# Patient Record
Sex: Male | Born: 1950 | Race: White | Hispanic: No | Marital: Married | Smoking: Former smoker
Health system: Southern US, Community
[De-identification: ages and names within clinical notes are randomized; demographics above are authoritative.]

## PROBLEM LIST (undated history)

## (undated) ENCOUNTER — Emergency Department (HOSPITAL_BASED_OUTPATIENT_CLINIC_OR_DEPARTMENT_OTHER): Admission: EM | Payer: Managed Care, Other (non HMO) | Source: Home / Self Care

## (undated) DIAGNOSIS — E1142 Type 2 diabetes mellitus with diabetic polyneuropathy: Secondary | ICD-10-CM

## (undated) HISTORY — DX: Type 2 diabetes mellitus with diabetic polyneuropathy: E11.42

---

## 2001-07-16 ENCOUNTER — Ambulatory Visit (HOSPITAL_COMMUNITY): Admission: RE | Admit: 2001-07-16 | Discharge: 2001-07-16 | Payer: Self-pay | Admitting: General Surgery

## 2001-09-10 ENCOUNTER — Ambulatory Visit (HOSPITAL_BASED_OUTPATIENT_CLINIC_OR_DEPARTMENT_OTHER): Admission: RE | Admit: 2001-09-10 | Discharge: 2001-09-10 | Payer: Self-pay | Admitting: Family Medicine

## 2004-01-30 ENCOUNTER — Ambulatory Visit: Payer: Self-pay | Admitting: Cardiology

## 2005-04-22 ENCOUNTER — Ambulatory Visit: Payer: Self-pay | Admitting: Cardiology

## 2010-09-12 ENCOUNTER — Other Ambulatory Visit: Payer: Self-pay | Admitting: Neurosurgery

## 2010-09-12 DIAGNOSIS — M47812 Spondylosis without myelopathy or radiculopathy, cervical region: Secondary | ICD-10-CM

## 2010-09-13 ENCOUNTER — Ambulatory Visit
Admission: RE | Admit: 2010-09-13 | Discharge: 2010-09-13 | Disposition: A | Discharge status: Home / Self Care | Payer: Managed Care, Other (non HMO) | Source: Ambulatory Visit | Attending: Neurosurgery | Admitting: Neurosurgery

## 2010-09-13 DIAGNOSIS — M47812 Spondylosis without myelopathy or radiculopathy, cervical region: Secondary | ICD-10-CM

## 2010-10-11 ENCOUNTER — Other Ambulatory Visit: Payer: Self-pay | Admitting: Neurosurgery

## 2010-10-11 DIAGNOSIS — M47812 Spondylosis without myelopathy or radiculopathy, cervical region: Secondary | ICD-10-CM

## 2010-10-15 ENCOUNTER — Ambulatory Visit
Admission: RE | Admit: 2010-10-15 | Discharge: 2010-10-15 | Disposition: A | Discharge status: Home / Self Care | Payer: Managed Care, Other (non HMO) | Source: Ambulatory Visit | Attending: Neurosurgery | Admitting: Neurosurgery

## 2010-10-15 DIAGNOSIS — M542 Cervicalgia: Secondary | ICD-10-CM

## 2010-10-15 DIAGNOSIS — M47812 Spondylosis without myelopathy or radiculopathy, cervical region: Secondary | ICD-10-CM

## 2010-10-15 MED ORDER — IOHEXOL 180 MG/ML  SOLN: 1.0000 mL | Freq: Once | INTRAMUSCULAR | Status: DC | PRN

## 2010-10-15 MED ORDER — TRIAMCINOLONE ACETONIDE 40 MG/ML IJ SUSP (RADIOLOGY)
60.0000 mg | Freq: Once | INTRAMUSCULAR | Status: AC
  Administered 2010-10-15: 60 mg via EPIDURAL

## 2010-10-15 MED ORDER — IOHEXOL 300 MG/ML  SOLN
1.0000 mL | Freq: Once | INTRAMUSCULAR | Status: AC | PRN
  Administered 2010-10-15: 1 mL via EPIDURAL

## 2015-08-01 ENCOUNTER — Encounter: Payer: Self-pay | Admitting: *Deleted

## 2015-08-01 NOTE — Progress Notes (Signed)
This encounter was created in error - please disregard.

## 2015-08-08 ENCOUNTER — Telehealth: Payer: Self-pay | Admitting: Cardiology

## 2015-08-08 ENCOUNTER — Encounter: Payer: Self-pay | Admitting: Cardiology

## 2015-08-08 ENCOUNTER — Encounter: Payer: Self-pay | Admitting: *Deleted

## 2015-08-08 ENCOUNTER — Ambulatory Visit (INDEPENDENT_AMBULATORY_CARE_PROVIDER_SITE_OTHER): Payer: BLUE CROSS/BLUE SHIELD | Admitting: Cardiology

## 2015-08-08 ENCOUNTER — Other Ambulatory Visit: Payer: Self-pay | Admitting: Cardiology

## 2015-08-08 VITALS — BP 118/78 | HR 72 | Ht 66.0 in | Wt 211.0 lb

## 2015-08-08 DIAGNOSIS — Z136 Encounter for screening for cardiovascular disorders: Secondary | ICD-10-CM

## 2015-08-08 DIAGNOSIS — E1142 Type 2 diabetes mellitus with diabetic polyneuropathy: Secondary | ICD-10-CM | POA: Diagnosis not present

## 2015-08-08 DIAGNOSIS — I200 Unstable angina: Secondary | ICD-10-CM

## 2015-08-08 DIAGNOSIS — E785 Hyperlipidemia, unspecified: Secondary | ICD-10-CM | POA: Diagnosis not present

## 2015-08-08 DIAGNOSIS — Z8249 Family history of ischemic heart disease and other diseases of the circulatory system: Secondary | ICD-10-CM

## 2015-08-08 NOTE — Telephone Encounter (Signed)
Left heart cath on Thursday, Aug 10, 2015 @9 :00 am with Dr. Katrinka BlazingSmith arrive @7 :00 am dx: accelerating angina Checking percert

## 2015-08-08 NOTE — Telephone Encounter (Signed)
No precert required 

## 2015-08-08 NOTE — Patient Instructions (Signed)
Your physician recommends that you continue on your current medications as directed. Please refer to the Current Medication list given to you today. Your physician has requested that you have a cardiac catheterization. Cardiac catheterization is used to diagnose and/or treat various heart conditions. Doctors may recommend this procedure for a number of different reasons. The most common reason is to evaluate chest pain. Chest pain can be a symptom of coronary artery disease (CAD), and cardiac catheterization can show whether plaque is narrowing or blocking your heart's arteries. This procedure is also used to evaluate the valves, as well as measure the blood flow and oxygen levels in different parts of your heart. For further information please visit www.cardiosmart.org. Please follow instruction sheet, as given. Your physician recommends that you schedule a follow-up appointment in: 1 month. 

## 2015-08-08 NOTE — Progress Notes (Signed)
  Cardiology Office Note  Date: 08/08/2015   ID: Devon Morris, DOB 05/02/1950, MRN [id number]  PCP: HOWARD, KEVIN, MD  Referring provider: Paul Sasser MD Consulting Cardiologist: Samuel McDowell, MD   Chief Complaint  Patient presents with  . Abnormal ECG  . Chest pain and dyspnea    History of Present Illness: Devon Morris is a 64 y.o. male referred for cardiology consultation by Dr. Sasser. He is here today with his daughter. I reviewed his records. States that over the last several months he has been having increasing episodes of vague left-sided chest discomfort, sometimes described as a tightness, sometimes more of a sharp sensation in his left arm. This occurs with emotional stress, also physical activity. He has been more fatigued and short of breath with activity as well. His wife has noticed this change. He tends to minimize his symptoms somewhat. He was seen by Dr. Sasser at which time ECG was obtained showing a new right bundle branch block compared to tracing several years ago, otherwise no acute ST segment changes.  History includes type 2 diabetes mellitus with peripheral neuropathy. He has been on oral agents, no insulin. Also elevated triglycerides. Family history includes premature CAD, patient's father had a heart attack in his 40s.  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  Past Medical History  Diagnosis Date  . Type 2 diabetes mellitus with peripheral neuropathy (HCC)     History reviewed. No pertinent past surgical history.  Current Outpatient Prescriptions  Medication Sig Dispense Refill  . aspirin 81 MG tablet Take 81 mg by mouth daily.    . glipiZIDE (GLUCOTROL) 10 MG tablet Take 10 mg by mouth 2 (two) times daily before a meal.    . metFORMIN (GLUCOPHAGE) 500 MG tablet Take 2,000 mg by mouth daily with breakfast.     No current facility-administered medications for this visit.   Allergies:  Review of  patient's allergies indicates no known allergies.   Social History: The patient  reports that he has quit smoking. His smoking use included Cigarettes. He has never used smokeless tobacco.   Family History: The patient's family history includes Heart attack in his father and paternal grandmother.   ROS:  Please see the history of present illness. Otherwise, complete review of systems is positive for some trouble with memory.  All other systems are reviewed and negative.   Physical Exam: VS:  BP 118/78 mmHg  Pulse 72  Ht 5' 6" (1.676 m)  Wt 211 lb (95.709 kg)  BMI 34.07 kg/m2  SpO2 98%, BMI Body mass index is 34.07 kg/(m^2).  Wt Readings from Last 3 Encounters:  08/08/15 211 lb (95.709 kg)  07/21/15 206 lb (93.441 kg)    General: Overweight bearded male, appears comfortable at rest. HEENT: Conjunctiva and lids normal, oropharynx clear with moist mucosa. Neck: Supple, no elevated JVP or carotid bruits, no thyromegaly. Lungs: Clear to auscultation, nonlabored breathing at rest. Cardiac: Regular rate and rhythm, no S3, soft systolic murmur, no pericardial rub. Abdomen: Protuberant with ventral hernia - nontender, bowel sounds present, no guarding or rebound. Extremities: No pitting edema, distal pulses 2+. Skin: Warm and dry. Scattered tattoos. Musculoskeletal: No kyphosis. Neuropsychiatric: Alert and oriented x3, affect grossly appropriate.  ECG: I personally reviewed the recent tracing from 08/01/2015 which showed sinus rhythm with right bundle branch block and lead motion artifact, leftward axis.  Recent Labwork:  April 2017: BUN 16, creatinine 1.1, potassium 4.8,   AST 20, ALT 25, cholesterol 238, triglycerides 571, HDL 29, LDL not calculated, hemoglobin A1c 9.2  Assessment and Plan:  1. Symptoms concerning for accelerating angina over the last several weeks to months in the setting of diabetes mellitus with peripheral neuropathy, hypertriglyceridemia with low HDL, and a family  history of premature CAD in his father. ECG shows right bundle branch block, otherwise no acute ST segment changes. We discussed options for further diagnosis including both noninvasive and invasive techniques, and he is in agreement to proceed with a cardiac catheterization for definitive evaluation of his coronary anatomy. Risks and benefits discussed.  2. Type 2 diabetes mellitus with peripheral neuropathy. Recent hemoglobin A1c 9.2. He is on Glucophage and Glucotrol per Dr. Dimas AguasHoward.  3. Hypertriglyceridemia with low HDL. Weight loss and more optimal diabetes management would be helpful, consider the addition of omega-3 supplements as well.  Current medicines were reviewed with the patient today.   Orders Placed This Encounter  Procedures  . EKG 12-Lead    Disposition: FU with me after cardiac catheterization.   Signed, Jonelle SidleSamuel G. McDowell, MD, [facility] 08/08/2015 9:13 AM    [facility] [address], [place], [place] [address] Phone: [phone]) [phone]; Fax: [phone]

## 2015-08-10 ENCOUNTER — Encounter (HOSPITAL_COMMUNITY): Payer: Self-pay | Admitting: Interventional Cardiology

## 2015-08-10 ENCOUNTER — Ambulatory Visit (HOSPITAL_COMMUNITY)
Admission: RE | Admit: 2015-08-10 | Discharge: 2015-08-10 | Disposition: A | Discharge status: Home / Self Care | Payer: BLUE CROSS/BLUE SHIELD | Source: Ambulatory Visit | Attending: Interventional Cardiology | Admitting: Interventional Cardiology

## 2015-08-10 ENCOUNTER — Encounter (HOSPITAL_COMMUNITY)
Admission: RE | Disposition: A | Discharge status: Home / Self Care | Payer: Self-pay | Source: Ambulatory Visit | Attending: Interventional Cardiology

## 2015-08-10 DIAGNOSIS — I4510 Unspecified right bundle-branch block: Secondary | ICD-10-CM | POA: Insufficient documentation

## 2015-08-10 DIAGNOSIS — R0600 Dyspnea, unspecified: Secondary | ICD-10-CM | POA: Diagnosis present

## 2015-08-10 DIAGNOSIS — Z87891 Personal history of nicotine dependence: Secondary | ICD-10-CM | POA: Diagnosis not present

## 2015-08-10 DIAGNOSIS — E1142 Type 2 diabetes mellitus with diabetic polyneuropathy: Secondary | ICD-10-CM | POA: Insufficient documentation

## 2015-08-10 DIAGNOSIS — E781 Pure hyperglyceridemia: Secondary | ICD-10-CM | POA: Insufficient documentation

## 2015-08-10 DIAGNOSIS — E119 Type 2 diabetes mellitus without complications: Secondary | ICD-10-CM

## 2015-08-10 DIAGNOSIS — Z8249 Family history of ischemic heart disease and other diseases of the circulatory system: Secondary | ICD-10-CM | POA: Diagnosis not present

## 2015-08-10 DIAGNOSIS — R0789 Other chest pain: Secondary | ICD-10-CM | POA: Diagnosis not present

## 2015-08-10 DIAGNOSIS — I2510 Atherosclerotic heart disease of native coronary artery without angina pectoris: Secondary | ICD-10-CM | POA: Insufficient documentation

## 2015-08-10 DIAGNOSIS — E1165 Type 2 diabetes mellitus with hyperglycemia: Secondary | ICD-10-CM | POA: Diagnosis not present

## 2015-08-10 DIAGNOSIS — Z7984 Long term (current) use of oral hypoglycemic drugs: Secondary | ICD-10-CM | POA: Insufficient documentation

## 2015-08-10 DIAGNOSIS — Z7982 Long term (current) use of aspirin: Secondary | ICD-10-CM | POA: Insufficient documentation

## 2015-08-10 DIAGNOSIS — I200 Unstable angina: Secondary | ICD-10-CM | POA: Insufficient documentation

## 2015-08-10 HISTORY — PX: CARDIAC CATHETERIZATION: SHX172

## 2015-08-10 LAB — BASIC METABOLIC PANEL
ANION GAP: 12 (ref 5–15)
BUN: 11 mg/dL (ref 6–20)
CHLORIDE: 101 mmol/L (ref 101–111)
CO2: 25 mmol/L (ref 22–32)
Calcium: 9.1 mg/dL (ref 8.9–10.3)
Creatinine, Ser: 0.99 mg/dL (ref 0.61–1.24)
GFR calc non Af Amer: >60 mL/min (ref >60)
Glucose, Bld: 162 mg/dL — ABNORMAL HIGH (ref 65–99)
POTASSIUM: 4.3 mmol/L (ref 3.5–5.1)
SODIUM: 138 mmol/L (ref 135–145)

## 2015-08-10 LAB — CBC
HEMATOCRIT: 38.4 % — AB (ref 39.0–52.0)
HEMOGLOBIN: 12.3 g/dL — AB (ref 13.0–17.0)
MCH: 28.5 pg (ref 26.0–34.0)
MCHC: 32 g/dL (ref 30.0–36.0)
MCV: 89.1 fL (ref 78.0–100.0)
Platelets: 215 10*3/uL (ref 150–400)
RBC: 4.31 MIL/uL (ref 4.22–5.81)
RDW: 12.8 % (ref 11.5–15.5)
WBC: 7.2 10*3/uL (ref 4.0–10.5)

## 2015-08-10 LAB — PROTIME-INR
INR: 1.08 (ref 0.00–1.49)
Prothrombin Time: 14.2 seconds (ref 11.6–15.2)

## 2015-08-10 LAB — GLUCOSE, CAPILLARY: GLUCOSE-CAPILLARY: 162 mg/dL — AB (ref 65–99)

## 2015-08-10 SURGERY — LEFT HEART CATH AND CORONARY ANGIOGRAPHY

## 2015-08-10 MED ORDER — VERAPAMIL HCL 2.5 MG/ML IV SOLN
INTRAVENOUS | Status: AC
  Filled 2015-08-10: qty 2

## 2015-08-10 MED ORDER — LIDOCAINE HCL (PF) 1 % IJ SOLN
INTRAMUSCULAR | Status: AC
  Filled 2015-08-10: qty 30

## 2015-08-10 MED ORDER — SODIUM CHLORIDE 0.9% FLUSH: 3.0000 mL | Freq: Two times a day (BID) | INTRAVENOUS | Status: DC

## 2015-08-10 MED ORDER — HEPARIN SODIUM (PORCINE) 1000 UNIT/ML IJ SOLN
INTRAMUSCULAR | Status: AC
  Filled 2015-08-10: qty 1

## 2015-08-10 MED ORDER — HEPARIN (PORCINE) IN NACL 2-0.9 UNIT/ML-% IJ SOLN
INTRAMUSCULAR | Status: DC | PRN
  Administered 2015-08-10: 1000 mL

## 2015-08-10 MED ORDER — SODIUM CHLORIDE 0.9% FLUSH: 3.0000 mL | INTRAVENOUS | Status: DC | PRN

## 2015-08-10 MED ORDER — SODIUM CHLORIDE 0.9 % IV SOLN: 250.0000 mL | INTRAVENOUS | Status: DC | PRN

## 2015-08-10 MED ORDER — MIDAZOLAM HCL 2 MG/2ML IJ SOLN
INTRAMUSCULAR | Status: AC
  Filled 2015-08-10: qty 2

## 2015-08-10 MED ORDER — HEPARIN (PORCINE) IN NACL 2-0.9 UNIT/ML-% IJ SOLN
INTRAMUSCULAR | Status: DC | PRN
  Administered 2015-08-10: 10 mL via INTRA_ARTERIAL

## 2015-08-10 MED ORDER — IOPAMIDOL (ISOVUE-370) INJECTION 76%
INTRAVENOUS | Status: AC
  Filled 2015-08-10: qty 100

## 2015-08-10 MED ORDER — FENTANYL CITRATE (PF) 100 MCG/2ML IJ SOLN
INTRAMUSCULAR | Status: AC
  Filled 2015-08-10: qty 2

## 2015-08-10 MED ORDER — HEPARIN SODIUM (PORCINE) 1000 UNIT/ML IJ SOLN
INTRAMUSCULAR | Status: DC | PRN
  Administered 2015-08-10: 5000 [IU] via INTRAVENOUS

## 2015-08-10 MED ORDER — ONDANSETRON HCL 4 MG/2ML IJ SOLN: 4.0000 mg | Freq: Four times a day (QID) | INTRAMUSCULAR | Status: DC | PRN

## 2015-08-10 MED ORDER — SODIUM CHLORIDE 0.9 % IV SOLN
INTRAVENOUS | Status: DC
  Administered 2015-08-10: 08:00:00 via INTRAVENOUS

## 2015-08-10 MED ORDER — FENTANYL CITRATE (PF) 100 MCG/2ML IJ SOLN
INTRAMUSCULAR | Status: DC | PRN
  Administered 2015-08-10: 50 ug via INTRAVENOUS

## 2015-08-10 MED ORDER — ACETAMINOPHEN 325 MG PO TABS: 650.0000 mg | ORAL_TABLET | ORAL | Status: DC | PRN

## 2015-08-10 MED ORDER — ASPIRIN 81 MG PO CHEW
81.0000 mg | CHEWABLE_TABLET | ORAL | Status: DC
Start: 2015-08-10 — End: 2015-08-10

## 2015-08-10 MED ORDER — MIDAZOLAM HCL 2 MG/2ML IJ SOLN
INTRAMUSCULAR | Status: DC | PRN
  Administered 2015-08-10: 1 mg via INTRAVENOUS

## 2015-08-10 MED ORDER — LIDOCAINE HCL (PF) 1 % IJ SOLN
INTRAMUSCULAR | Status: DC | PRN
Start: 2015-08-10 — End: 2015-08-10
  Administered 2015-08-10: 2 mL

## 2015-08-10 MED ORDER — SODIUM CHLORIDE 0.9 % WEIGHT BASED INFUSION: 1.0000 mL/kg/h | INTRAVENOUS | Status: DC

## 2015-08-10 MED ORDER — HEPARIN (PORCINE) IN NACL 2-0.9 UNIT/ML-% IJ SOLN
INTRAMUSCULAR | Status: AC
  Filled 2015-08-10: qty 1000

## 2015-08-10 SURGICAL SUPPLY — 9 items
CATH INFINITI 5 FR JL3.5 (CATHETERS) ×2 IMPLANT
CATH INFINITI JR4 5F (CATHETERS) ×2 IMPLANT
DEVICE RAD COMP TR BAND LRG (VASCULAR PRODUCTS) ×2 IMPLANT
GLIDESHEATH SLEND A-KIT 6F 22G (SHEATH) ×2 IMPLANT
KIT HEART LEFT (KITS) ×3 IMPLANT
PACK CARDIAC CATHETERIZATION (CUSTOM PROCEDURE TRAY) ×3 IMPLANT
TRANSDUCER W/STOPCOCK (MISCELLANEOUS) ×3 IMPLANT
TUBING CIL FLEX 10 FLL-RA (TUBING) ×3 IMPLANT
WIRE SAFE-T 1.5MM-J .035X260CM (WIRE) ×2 IMPLANT

## 2015-08-10 NOTE — Discharge Instructions (Signed)
HOLD METFORMIN // RESTART Sunday MORNING  Radial Site Care Refer to this sheet in the next few weeks. These instructions provide you with information about caring for yourself after your procedure. Your health care provider may also give you more specific instructions. Your treatment has been planned according to current medical practices, but problems sometimes occur. Call your health care provider if you have any problems or questions after your procedure. WHAT TO EXPECT AFTER THE PROCEDURE After your procedure, it is typical to have the following:  Bruising at the radial site that usually fades within 1-2 weeks.  Blood collecting in the tissue (hematoma) that may be painful to the touch. It should usually decrease in size and tenderness within 1-2 weeks. HOME CARE INSTRUCTIONS  Take medicines only as directed by your health care provider.  You may shower 24-48 hours after the procedure or as directed by your health care provider. Remove the bandage (dressing) and gently wash the site with plain soap and water. Pat the area dry with a clean towel. Do not rub the site, because this may cause bleeding.  Do not take baths, swim, or use a hot tub until your health care provider approves.  Check your insertion site every day for redness, swelling, or drainage.  Do not apply powder or lotion to the site.  Do not flex or bend the affected arm for 24 hours or as directed by your health care provider.  Do not push or pull heavy objects with the affected arm for 24 hours or as directed by your health care provider.  Do not lift over 10 lb (4.5 kg) for 5 days after your procedure or as directed by your health care provider.  Ask your health care provider when it is okay to:  Return to work or school.  Resume usual physical activities or sports.  Resume sexual activity.  Do not drive home if you are discharged the same day as the procedure. Have someone else drive you.  You may drive 24  hours after the procedure unless otherwise instructed by your health care provider.  Do not operate machinery or power tools for 24 hours after the procedure.  If your procedure was done as an outpatient procedure, which means that you went home the same day as your procedure, a responsible adult should be with you for the first 24 hours after you arrive home.  Keep all follow-up visits as directed by your health care provider. This is important. SEEK MEDICAL CARE IF:  You have a fever.  You have chills.  You have increased bleeding from the radial site. Hold pressure on the site. Call 911 SEEK IMMEDIATE MEDICAL CARE IF:  You have unusual pain at the radial site.  You have redness, warmth, or swelling at the radial site.  You have drainage (other than a small amount of blood on the dressing) from the radial site.  The radial site is bleeding, and the bleeding does not stop after 30 minutes of holding steady pressure on the site.  Your arm or hand becomes pale, cool, tingly, or numb.   This information is not intended to replace advice given to you by your health care provider. Make sure you discuss any questions you have with your health care provider.   Document Released: 04/13/2010 Document Revised: 04/01/2014 Document Reviewed: 09/27/2013 Elsevier Interactive Patient Education [employer] Elsevier Inc.

## 2015-08-10 NOTE — H&P (View-Only) (Signed)
Cardiology Office Note  Date: 08/08/2015   ID: Devon Morris, DOB 05-Jun-1950, MRN [id number]  PCP: Selinda Flavin, MD  Referring provider: Fara Chute MD Consulting Cardiologist: Nona Dell, MD   Chief Complaint  Patient presents with  . Abnormal ECG  . Chest pain and dyspnea    History of Present Illness: Devon Morris is a 65 y.o. male referred for cardiology consultation by Dr. Neita Carp. He is here today with his daughter. I reviewed his records. States that over the last several months he has been having increasing episodes of vague left-sided chest discomfort, sometimes described as a tightness, sometimes more of a sharp sensation in his left arm. This occurs with emotional stress, also physical activity. He has been more fatigued and short of breath with activity as well. His wife has noticed this change. He tends to minimize his symptoms somewhat. He was seen by Dr. Neita Carp at which time ECG was obtained showing a new right bundle branch block compared to tracing several years ago, otherwise no acute ST segment changes.  History includes type 2 diabetes mellitus with peripheral neuropathy. He has been on oral agents, no insulin. Also elevated triglycerides. Family history includes premature CAD, patient's father had a heart attack in his 84s.  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  Past Medical History  Diagnosis Date  . Type 2 diabetes mellitus with peripheral neuropathy (HCC)     History reviewed. No pertinent past surgical history.  Current Outpatient Prescriptions  Medication Sig Dispense Refill  . aspirin 81 MG tablet Take 81 mg by mouth daily.    Marland Kitchen glipiZIDE (GLUCOTROL) 10 MG tablet Take 10 mg by mouth 2 (two) times daily before a meal.    . metFORMIN (GLUCOPHAGE) 500 MG tablet Take 2,000 mg by mouth daily with breakfast.     No current facility-administered medications for this visit.   Allergies:  Review of  patient's allergies indicates no known allergies.   Social History: The patient  reports that he has quit smoking. His smoking use included Cigarettes. He has never used smokeless tobacco.   Family History: The patient's family history includes Heart attack in his father and paternal grandmother.   ROS:  Please see the history of present illness. Otherwise, complete review of systems is positive for some trouble with memory.  All other systems are reviewed and negative.   Physical Exam: VS:  BP 118/78 mmHg  Pulse 72  Ht  (1.676 m)  Wt 211 lb (95.709 kg)  BMI 34.07 kg/m2  SpO2 98%, BMI Body mass index is 34.07 kg/(m^2).  Wt Readings from Last 3 Encounters:  08/08/15 211 lb (95.709 kg)  07/21/15 206 lb (93.441 kg)    General: Overweight bearded male, appears comfortable at rest. HEENT: Conjunctiva and lids normal, oropharynx clear with moist mucosa. Neck: Supple, no elevated JVP or carotid bruits, no thyromegaly. Lungs: Clear to auscultation, nonlabored breathing at rest. Cardiac: Regular rate and rhythm, no S3, soft systolic murmur, no pericardial rub. Abdomen: Protuberant with ventral hernia - nontender, bowel sounds present, no guarding or rebound. Extremities: No pitting edema, distal pulses 2+. Skin: Warm and dry. Scattered tattoos. Musculoskeletal: No kyphosis. Neuropsychiatric: Alert and oriented x3, affect grossly appropriate.  ECG: I personally reviewed the recent tracing from 08/01/2015 which showed sinus rhythm with right bundle branch block and lead motion artifact, leftward axis.  Recent Labwork:  April 2017: BUN 16, creatinine 1.1, potassium 4.8,  AST 20, ALT 25, cholesterol 238, triglycerides 571, HDL 29, LDL not calculated, hemoglobin A1c 9.2  Assessment and Plan:  1. Symptoms concerning for accelerating angina over the last several weeks to months in the setting of diabetes mellitus with peripheral neuropathy, hypertriglyceridemia with low HDL, and a family  history of premature CAD in his father. ECG shows right bundle branch block, otherwise no acute ST segment changes. We discussed options for further diagnosis including both noninvasive and invasive techniques, and he is in agreement to proceed with a cardiac catheterization for definitive evaluation of his coronary anatomy. Risks and benefits discussed.  2. Type 2 diabetes mellitus with peripheral neuropathy. Recent hemoglobin A1c 9.2. He is on Glucophage and Glucotrol per Dr. Dimas AguasHoward.  3. Hypertriglyceridemia with low HDL. Weight loss and more optimal diabetes management would be helpful, consider the addition of omega-3 supplements as well.  Current medicines were reviewed with the patient today.   Orders Placed This Encounter  Procedures  . EKG 12-Lead    Disposition: FU with me after cardiac catheterization.   Signed, Jonelle SidleSamuel G. McDowell, MD, [facility] 08/08/2015 9:13 AM    [facility] [address], [place], [place] [address] Phone: [phone]) [phone]; Fax: [phone]

## 2015-08-10 NOTE — Interval H&P Note (Signed)
Cath Lab Visit (complete for each Cath Lab visit)  Clinical Evaluation Leading to the Procedure:   ACS: No.  Non-ACS:    Anginal Classification: CCS III  Anti-ischemic medical therapy: No medical therapy for CAD  Non-Invasive Test Results: No non-invasive testing performed  Prior CABG: No previous CABG      History and Physical Interval Note:  08/10/2015 8:34 AM  Devon Morris  has presented today for surgery, with the diagnosis of angina  The various methods of treatment have been discussed with the patient and family. After consideration of risks, benefits and other options for treatment, the patient has consented to  Procedure(s): Left Heart Cath and Coronary Angiography (N/A) as a surgical intervention .  The patient's history has been reviewed, patient examined, no change in status, stable for surgery.  I have reviewed the patient's chart and labs.  Questions were answered to the patient's satisfaction.     Lyn RecordsHenry W Joella Saefong III

## 2015-09-06 ENCOUNTER — Encounter: Payer: Self-pay | Admitting: Cardiology

## 2015-09-06 ENCOUNTER — Encounter: Payer: BLUE CROSS/BLUE SHIELD | Admitting: Cardiology

## 2015-09-06 NOTE — Progress Notes (Signed)
No show  This encounter was created in error - please disregard.

## 2015-09-12 ENCOUNTER — Encounter: Payer: Self-pay | Admitting: Cardiology

## 2015-09-12 ENCOUNTER — Ambulatory Visit (INDEPENDENT_AMBULATORY_CARE_PROVIDER_SITE_OTHER): Payer: BLUE CROSS/BLUE SHIELD | Admitting: Cardiology

## 2015-09-12 VITALS — BP 120/70 | HR 98 | Ht 66.0 in | Wt 217.2 lb

## 2015-09-12 DIAGNOSIS — E785 Hyperlipidemia, unspecified: Secondary | ICD-10-CM

## 2015-09-12 DIAGNOSIS — I25119 Atherosclerotic heart disease of native coronary artery with unspecified angina pectoris: Secondary | ICD-10-CM

## 2015-09-12 DIAGNOSIS — E1142 Type 2 diabetes mellitus with diabetic polyneuropathy: Secondary | ICD-10-CM

## 2015-09-12 NOTE — Progress Notes (Signed)
Cardiology Office Note  Date: 09/12/2015   ID: Devon BenchDavid O Uptain, DOB 13-Oct-1950, MRN [id number]  PCP: Selinda FlavinHOWARD, KEVIN, MD  Primary Cardiologist: Nona DellSamuel Corvin Sorbo, MD   Chief Complaint  Patient presents with  . Follow-up cardiac catheterization    History of Present Illness: Devon Morris is a 65 y.o. male seen in consultation with May for evaluation of symptoms concerning for unstable angina in the setting of diabetes mellitus and family history of premature CAD. He was referred for a diagnostic cardiac catheterization which is outlined below, and demonstrated nonobstructive multivessel atherosclerosis which was felt to be best managed medically without obvious culprit lesion to require revascularization.  He is here today for a follow-up visit with his wife. We went over the results of his procedure. States that he feels more reassured at this point. I did discuss with him considering going on statin therapy for plaque stabilization and elevated lipids. He reports being on Lipitor in the past, cannot recall why it was stopped by Dr. Dimas AguasHoward but he does not remember any specific allergies or other reactions. Otherwise he continues on aspirin and has as needed nitroglycerin. Recommended weight loss.  Past Medical History  Diagnosis Date  . Type 2 diabetes mellitus with peripheral neuropathy [facility](HCC)     Past Surgical History  Procedure Laterality Date  . Cardiac catheterization N/A 08/10/2015    Procedure: Left Heart Cath and Coronary Angiography;  Surgeon: Lyn RecordsHenry W Smith, MD;  [facility];  Service: Cardiovascular;  Laterality: N/A;    Current Outpatient Prescriptions  Medication Sig Dispense Refill  . aspirin 81 MG tablet Take 81 mg by mouth daily.    Marland Kitchen. glipiZIDE (GLUCOTROL) 10 MG tablet Take 10 mg by mouth 2 (two) times daily before a meal.    . ibuprofen (ADVIL,MOTRIN) 200 MG tablet Take 400 mg by mouth daily as needed for headache.    . metFORMIN (GLUCOPHAGE) 500 MG  tablet Take 1,000 mg by mouth 2 (two) times daily with a meal.     . nitroGLYCERIN (NITROSTAT) 0.4 MG SL tablet Place 0.4 mg under the tongue every 5 (five) minutes as needed for chest pain.   1   No current facility-administered medications for this visit.   Allergies:  Review of patient's allergies indicates no known allergies.   Social History: The patient  reports that he has quit smoking. His smoking use included Cigarettes. He has never used smokeless tobacco.   ROS:  Please see the history of present illness. Otherwise, complete review of systems is positive for fatigue, arthritic pains.  All other systems are reviewed and negative.   Physical Exam: VS:  BP 120/70 mmHg  Pulse 98  Ht 5\' 6"  (1.676 m)  Wt 217 lb 3.2 oz (98.521 kg)  BMI 35.07 kg/m2  SpO2 96%, BMI Body mass index is 35.07 kg/(m^2).  Wt Readings from Last 3 Encounters:  09/12/15 217 lb 3.2 oz (98.521 kg)  08/10/15 211 lb (95.709 kg)  08/08/15 211 lb (95.709 kg)    General: Overweight bearded male, appears comfortable at rest. HEENT: Conjunctiva and lids normal, oropharynx clear with moist mucosa. Neck: Supple, no elevated JVP or carotid bruits, no thyromegaly. Lungs: Clear to auscultation, nonlabored breathing at rest. Cardiac: Regular rate and rhythm, no S3, soft systolic murmur, no pericardial rub. Abdomen: Protuberant with ventral hernia - nontender, bowel sounds present, no guarding or rebound. Extremities: No pitting edema, distal pulses 2+. Skin: Warm and dry. Scattered tattoos. Musculoskeletal: No kyphosis.  Neuropsychiatric: Alert and oriented x3, affect grossly appropriate.  ECG: I personally reviewed the tracing from5/16/2017 which showed sinus rhythm with right branch block.  Recent Labwork: 08/10/2015: BUN 11; Creatinine, Ser 0.99; Hemoglobin 12.3*; Platelets 215; Potassium 4.3; Sodium 138  April 2017: BUN 16, creatinine 1.1, potassium 4.8, AST 20, ALT 25, cholesterol 238, triglycerides 571, HDL 29,  LDL not calculated, hemoglobin A1c 9.2  Other Studies Reviewed Today:  Cardiac catheterization 08/10/2015: 1. Mid RCA to Dist RCA lesion, 40% stenosed. 2. Dist LAD lesion, 60% stenosed. 3. Mid Cx to Dist Cx lesion, 50% stenosed.   Nonobstructive coronary disease with 40% mid RCA 60% mid to distal LAD and segmental 50% mid circumflex.  Normal left ventricular systolic function with normal hemodynamics. EDP is 15 pre-angiogram.      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 No culprit lesions to require revascularization. Recommend medical therapy, currently on aspirin with as needed nitroglycerin. Would consider addition of statin and he will address this with Dr. Dimas Aguas to make sure there were no prior intolerances. Blood pressure is normal today.  2.  Obesity, weight loss recommended.  3. Type 2 diabetes mellitus with peripheral neuropathy. Beats to focus on better glucose control with last hemoglobin A1c 9.2.  4. Hypertriglyceridemia in the setting of type 2 diabetes mellitus. As noted above, consider statin and possibly omega-3 supplements.  Current medicines were reviewed with the patient today.  Disposition: Follow-up with me in one year.  Signed, Jonelle Sidle, MD, [facility] 09/12/2015 4:02 PM    [facility] at [facility] [address] [place], [place], [place] [address] Phone: [phone]; Fax: [phone]

## 2015-09-12 NOTE — Patient Instructions (Signed)

## 2016-04-02 DIAGNOSIS — Z6831 Body mass index (BMI) 31.0-31.9, adult: Secondary | ICD-10-CM | POA: Diagnosis not present

## 2016-04-02 DIAGNOSIS — E1140 Type 2 diabetes mellitus with diabetic neuropathy, unspecified: Secondary | ICD-10-CM | POA: Diagnosis not present

## 2016-04-02 DIAGNOSIS — M1990 Unspecified osteoarthritis, unspecified site: Secondary | ICD-10-CM | POA: Diagnosis not present

## 2016-09-18 DIAGNOSIS — Z6831 Body mass index (BMI) 31.0-31.9, adult: Secondary | ICD-10-CM | POA: Diagnosis not present

## 2016-09-18 DIAGNOSIS — M1990 Unspecified osteoarthritis, unspecified site: Secondary | ICD-10-CM | POA: Diagnosis not present

## 2016-09-18 DIAGNOSIS — E1165 Type 2 diabetes mellitus with hyperglycemia: Secondary | ICD-10-CM | POA: Diagnosis not present

## 2016-09-18 DIAGNOSIS — G3184 Mild cognitive impairment, so stated: Secondary | ICD-10-CM | POA: Diagnosis not present

## 2016-09-18 DIAGNOSIS — E1140 Type 2 diabetes mellitus with diabetic neuropathy, unspecified: Secondary | ICD-10-CM | POA: Diagnosis not present

## 2016-12-05 DIAGNOSIS — H10022 Other mucopurulent conjunctivitis, left eye: Secondary | ICD-10-CM | POA: Diagnosis not present

## 2016-12-05 DIAGNOSIS — Z6832 Body mass index (BMI) 32.0-32.9, adult: Secondary | ICD-10-CM | POA: Diagnosis not present

## 2017-09-29 DIAGNOSIS — Z6832 Body mass index (BMI) 32.0-32.9, adult: Secondary | ICD-10-CM | POA: Diagnosis not present

## 2017-09-29 DIAGNOSIS — E1165 Type 2 diabetes mellitus with hyperglycemia: Secondary | ICD-10-CM | POA: Diagnosis not present

## 2017-09-29 DIAGNOSIS — M1990 Unspecified osteoarthritis, unspecified site: Secondary | ICD-10-CM | POA: Diagnosis not present

## 2017-09-29 DIAGNOSIS — G3184 Mild cognitive impairment, so stated: Secondary | ICD-10-CM | POA: Diagnosis not present

## 2017-09-29 DIAGNOSIS — Z6831 Body mass index (BMI) 31.0-31.9, adult: Secondary | ICD-10-CM | POA: Diagnosis not present

## 2017-09-29 DIAGNOSIS — E782 Mixed hyperlipidemia: Secondary | ICD-10-CM | POA: Diagnosis not present

## 2017-09-29 DIAGNOSIS — E1140 Type 2 diabetes mellitus with diabetic neuropathy, unspecified: Secondary | ICD-10-CM | POA: Diagnosis not present

## 2017-12-11 DIAGNOSIS — Z23 Encounter for immunization: Secondary | ICD-10-CM | POA: Diagnosis not present

## 2018-03-02 DIAGNOSIS — N481 Balanitis: Secondary | ICD-10-CM | POA: Diagnosis not present

## 2018-03-02 DIAGNOSIS — Z6831 Body mass index (BMI) 31.0-31.9, adult: Secondary | ICD-10-CM | POA: Diagnosis not present

## 2018-03-02 DIAGNOSIS — E1140 Type 2 diabetes mellitus with diabetic neuropathy, unspecified: Secondary | ICD-10-CM | POA: Diagnosis not present

## 2018-03-27 DIAGNOSIS — S0300XA Dislocation of jaw, unspecified side, initial encounter: Secondary | ICD-10-CM | POA: Diagnosis not present

## 2018-03-27 DIAGNOSIS — E1140 Type 2 diabetes mellitus with diabetic neuropathy, unspecified: Secondary | ICD-10-CM | POA: Diagnosis not present

## 2018-03-27 DIAGNOSIS — E1165 Type 2 diabetes mellitus with hyperglycemia: Secondary | ICD-10-CM | POA: Diagnosis not present

## 2018-09-07 DIAGNOSIS — Z23 Encounter for immunization: Secondary | ICD-10-CM | POA: Diagnosis not present

## 2018-09-07 DIAGNOSIS — E1140 Type 2 diabetes mellitus with diabetic neuropathy, unspecified: Secondary | ICD-10-CM | POA: Diagnosis not present

## 2018-09-07 DIAGNOSIS — E1165 Type 2 diabetes mellitus with hyperglycemia: Secondary | ICD-10-CM | POA: Diagnosis not present

## 2018-11-14 DIAGNOSIS — E1140 Type 2 diabetes mellitus with diabetic neuropathy, unspecified: Secondary | ICD-10-CM | POA: Diagnosis not present

## 2018-11-14 DIAGNOSIS — R05 Cough: Secondary | ICD-10-CM | POA: Diagnosis not present

## 2018-12-23 DIAGNOSIS — E782 Mixed hyperlipidemia: Secondary | ICD-10-CM | POA: Diagnosis not present

## 2018-12-23 DIAGNOSIS — I10 Essential (primary) hypertension: Secondary | ICD-10-CM | POA: Diagnosis not present

## 2019-01-22 DIAGNOSIS — E782 Mixed hyperlipidemia: Secondary | ICD-10-CM | POA: Diagnosis not present

## 2019-01-22 DIAGNOSIS — I10 Essential (primary) hypertension: Secondary | ICD-10-CM | POA: Diagnosis not present

## 2019-01-26 DIAGNOSIS — E782 Mixed hyperlipidemia: Secondary | ICD-10-CM | POA: Diagnosis not present

## 2019-01-26 DIAGNOSIS — Z23 Encounter for immunization: Secondary | ICD-10-CM | POA: Diagnosis not present

## 2019-01-26 DIAGNOSIS — Z0001 Encounter for general adult medical examination with abnormal findings: Secondary | ICD-10-CM | POA: Diagnosis not present

## 2019-01-26 DIAGNOSIS — E1140 Type 2 diabetes mellitus with diabetic neuropathy, unspecified: Secondary | ICD-10-CM | POA: Diagnosis not present

## 2019-01-26 DIAGNOSIS — G3184 Mild cognitive impairment, so stated: Secondary | ICD-10-CM | POA: Diagnosis not present

## 2019-01-26 DIAGNOSIS — D519 Vitamin B12 deficiency anemia, unspecified: Secondary | ICD-10-CM | POA: Diagnosis not present

## 2019-01-26 DIAGNOSIS — E1165 Type 2 diabetes mellitus with hyperglycemia: Secondary | ICD-10-CM | POA: Diagnosis not present

## 2019-01-30 DIAGNOSIS — Z23 Encounter for immunization: Secondary | ICD-10-CM | POA: Diagnosis not present

## 2019-01-30 DIAGNOSIS — Z0001 Encounter for general adult medical examination with abnormal findings: Secondary | ICD-10-CM | POA: Diagnosis not present

## 2019-02-17 DIAGNOSIS — E11319 Type 2 diabetes mellitus with unspecified diabetic retinopathy without macular edema: Secondary | ICD-10-CM | POA: Diagnosis not present

## 2019-02-22 DIAGNOSIS — E782 Mixed hyperlipidemia: Secondary | ICD-10-CM | POA: Diagnosis not present

## 2019-02-22 DIAGNOSIS — E1140 Type 2 diabetes mellitus with diabetic neuropathy, unspecified: Secondary | ICD-10-CM | POA: Diagnosis not present

## 2019-03-25 DIAGNOSIS — E782 Mixed hyperlipidemia: Secondary | ICD-10-CM | POA: Diagnosis not present

## 2019-03-25 DIAGNOSIS — I10 Essential (primary) hypertension: Secondary | ICD-10-CM | POA: Diagnosis not present

## 2019-04-23 DIAGNOSIS — I10 Essential (primary) hypertension: Secondary | ICD-10-CM | POA: Diagnosis not present

## 2019-04-23 DIAGNOSIS — E7849 Other hyperlipidemia: Secondary | ICD-10-CM | POA: Diagnosis not present

## 2019-05-04 DIAGNOSIS — E1165 Type 2 diabetes mellitus with hyperglycemia: Secondary | ICD-10-CM | POA: Diagnosis not present

## 2019-05-04 DIAGNOSIS — E1140 Type 2 diabetes mellitus with diabetic neuropathy, unspecified: Secondary | ICD-10-CM | POA: Diagnosis not present

## 2019-05-21 DIAGNOSIS — I10 Essential (primary) hypertension: Secondary | ICD-10-CM | POA: Diagnosis not present

## 2019-05-21 DIAGNOSIS — E7849 Other hyperlipidemia: Secondary | ICD-10-CM | POA: Diagnosis not present

## 2019-06-10 ENCOUNTER — Other Ambulatory Visit: Payer: Self-pay

## 2019-06-10 NOTE — Patient Outreach (Signed)
[facility]) Care Management  06/10/2019  [name] [birth year] [phone]   Medication Adherence call to Mr. Devon Morris HIPPA Compliant Voice message left with a call back number. Mr. Devon Morris is showing past due on Rosuvastatin 5 mg under [employer].  Lillia Abed CPhT Pharmacy Technician [facility] Care Management Direct Dial [phone]  Fax [phone] [email or web address]

## 2019-06-23 DIAGNOSIS — I10 Essential (primary) hypertension: Secondary | ICD-10-CM | POA: Diagnosis not present

## 2019-06-23 DIAGNOSIS — E7849 Other hyperlipidemia: Secondary | ICD-10-CM | POA: Diagnosis not present

## 2019-07-23 DIAGNOSIS — E7849 Other hyperlipidemia: Secondary | ICD-10-CM | POA: Diagnosis not present

## 2019-07-23 DIAGNOSIS — I10 Essential (primary) hypertension: Secondary | ICD-10-CM | POA: Diagnosis not present

## 2019-09-22 DIAGNOSIS — G3184 Mild cognitive impairment, so stated: Secondary | ICD-10-CM | POA: Diagnosis not present

## 2019-09-22 DIAGNOSIS — Z7984 Long term (current) use of oral hypoglycemic drugs: Secondary | ICD-10-CM | POA: Diagnosis not present

## 2019-09-22 DIAGNOSIS — E7849 Other hyperlipidemia: Secondary | ICD-10-CM | POA: Diagnosis not present

## 2019-09-22 DIAGNOSIS — E1165 Type 2 diabetes mellitus with hyperglycemia: Secondary | ICD-10-CM | POA: Diagnosis not present

## 2019-10-22 DIAGNOSIS — Z7984 Long term (current) use of oral hypoglycemic drugs: Secondary | ICD-10-CM | POA: Diagnosis not present

## 2019-10-22 DIAGNOSIS — G3184 Mild cognitive impairment, so stated: Secondary | ICD-10-CM | POA: Diagnosis not present

## 2019-10-22 DIAGNOSIS — E1165 Type 2 diabetes mellitus with hyperglycemia: Secondary | ICD-10-CM | POA: Diagnosis not present

## 2019-10-22 DIAGNOSIS — E7849 Other hyperlipidemia: Secondary | ICD-10-CM | POA: Diagnosis not present

## 2019-11-01 DIAGNOSIS — Z1211 Encounter for screening for malignant neoplasm of colon: Secondary | ICD-10-CM | POA: Diagnosis not present

## 2019-11-18 DIAGNOSIS — Z79899 Other long term (current) drug therapy: Secondary | ICD-10-CM | POA: Diagnosis not present

## 2019-11-18 DIAGNOSIS — E785 Hyperlipidemia, unspecified: Secondary | ICD-10-CM | POA: Diagnosis not present

## 2019-11-18 DIAGNOSIS — Z7984 Long term (current) use of oral hypoglycemic drugs: Secondary | ICD-10-CM | POA: Diagnosis not present

## 2019-11-18 DIAGNOSIS — Z1211 Encounter for screening for malignant neoplasm of colon: Secondary | ICD-10-CM | POA: Diagnosis not present

## 2019-11-18 DIAGNOSIS — K641 Second degree hemorrhoids: Secondary | ICD-10-CM | POA: Diagnosis not present

## 2019-11-18 DIAGNOSIS — Q438 Other specified congenital malformations of intestine: Secondary | ICD-10-CM | POA: Diagnosis not present

## 2019-11-18 DIAGNOSIS — Z7982 Long term (current) use of aspirin: Secondary | ICD-10-CM | POA: Diagnosis not present

## 2019-11-18 DIAGNOSIS — E119 Type 2 diabetes mellitus without complications: Secondary | ICD-10-CM | POA: Diagnosis not present

## 2020-01-27 DIAGNOSIS — E1165 Type 2 diabetes mellitus with hyperglycemia: Secondary | ICD-10-CM | POA: Diagnosis not present

## 2020-01-27 DIAGNOSIS — D519 Vitamin B12 deficiency anemia, unspecified: Secondary | ICD-10-CM | POA: Diagnosis not present

## 2020-01-27 DIAGNOSIS — E1140 Type 2 diabetes mellitus with diabetic neuropathy, unspecified: Secondary | ICD-10-CM | POA: Diagnosis not present

## 2020-01-27 DIAGNOSIS — Z0001 Encounter for general adult medical examination with abnormal findings: Secondary | ICD-10-CM | POA: Diagnosis not present

## 2020-01-27 DIAGNOSIS — E782 Mixed hyperlipidemia: Secondary | ICD-10-CM | POA: Diagnosis not present

## 2020-01-27 DIAGNOSIS — Z1329 Encounter for screening for other suspected endocrine disorder: Secondary | ICD-10-CM | POA: Diagnosis not present

## 2020-01-29 DIAGNOSIS — Z0001 Encounter for general adult medical examination with abnormal findings: Secondary | ICD-10-CM | POA: Diagnosis not present

## 2020-01-29 DIAGNOSIS — Z23 Encounter for immunization: Secondary | ICD-10-CM | POA: Diagnosis not present

## 2022-03-28 DIAGNOSIS — M545 Low back pain, unspecified: Secondary | ICD-10-CM | POA: Diagnosis not present

## 2022-03-28 DIAGNOSIS — R300 Dysuria: Secondary | ICD-10-CM | POA: Diagnosis not present

## 2022-04-29 DIAGNOSIS — E7849 Other hyperlipidemia: Secondary | ICD-10-CM | POA: Diagnosis not present

## 2022-04-29 DIAGNOSIS — E1140 Type 2 diabetes mellitus with diabetic neuropathy, unspecified: Secondary | ICD-10-CM | POA: Diagnosis not present

## 2022-04-29 DIAGNOSIS — E1165 Type 2 diabetes mellitus with hyperglycemia: Secondary | ICD-10-CM | POA: Diagnosis not present

## 2022-05-02 DIAGNOSIS — R030 Elevated blood-pressure reading, without diagnosis of hypertension: Secondary | ICD-10-CM | POA: Diagnosis not present

## 2022-05-02 DIAGNOSIS — E7849 Other hyperlipidemia: Secondary | ICD-10-CM | POA: Diagnosis not present

## 2022-05-02 DIAGNOSIS — N138 Other obstructive and reflux uropathy: Secondary | ICD-10-CM | POA: Diagnosis not present

## 2022-05-02 DIAGNOSIS — E1140 Type 2 diabetes mellitus with diabetic neuropathy, unspecified: Secondary | ICD-10-CM | POA: Diagnosis not present

## 2022-05-02 DIAGNOSIS — I2510 Atherosclerotic heart disease of native coronary artery without angina pectoris: Secondary | ICD-10-CM | POA: Diagnosis not present

## 2022-05-02 DIAGNOSIS — K8020 Calculus of gallbladder without cholecystitis without obstruction: Secondary | ICD-10-CM | POA: Diagnosis not present

## 2022-09-04 DIAGNOSIS — E1165 Type 2 diabetes mellitus with hyperglycemia: Secondary | ICD-10-CM | POA: Diagnosis not present

## 2022-09-04 DIAGNOSIS — E1140 Type 2 diabetes mellitus with diabetic neuropathy, unspecified: Secondary | ICD-10-CM | POA: Diagnosis not present

## 2022-09-04 DIAGNOSIS — E7849 Other hyperlipidemia: Secondary | ICD-10-CM | POA: Diagnosis not present

## 2022-09-12 DIAGNOSIS — E1140 Type 2 diabetes mellitus with diabetic neuropathy, unspecified: Secondary | ICD-10-CM | POA: Diagnosis not present

## 2022-09-12 DIAGNOSIS — N138 Other obstructive and reflux uropathy: Secondary | ICD-10-CM | POA: Diagnosis not present

## 2022-09-12 DIAGNOSIS — K8020 Calculus of gallbladder without cholecystitis without obstruction: Secondary | ICD-10-CM | POA: Diagnosis not present

## 2022-09-12 DIAGNOSIS — E7849 Other hyperlipidemia: Secondary | ICD-10-CM | POA: Diagnosis not present

## 2022-09-12 DIAGNOSIS — R030 Elevated blood-pressure reading, without diagnosis of hypertension: Secondary | ICD-10-CM | POA: Diagnosis not present

## 2022-09-12 DIAGNOSIS — I2510 Atherosclerotic heart disease of native coronary artery without angina pectoris: Secondary | ICD-10-CM | POA: Diagnosis not present

## 2022-09-18 DIAGNOSIS — M5430 Sciatica, unspecified side: Secondary | ICD-10-CM | POA: Diagnosis not present

## 2023-01-08 DIAGNOSIS — E1165 Type 2 diabetes mellitus with hyperglycemia: Secondary | ICD-10-CM | POA: Diagnosis not present

## 2023-01-08 DIAGNOSIS — E7849 Other hyperlipidemia: Secondary | ICD-10-CM | POA: Diagnosis not present

## 2023-01-08 DIAGNOSIS — E782 Mixed hyperlipidemia: Secondary | ICD-10-CM | POA: Diagnosis not present

## 2023-01-08 DIAGNOSIS — E1140 Type 2 diabetes mellitus with diabetic neuropathy, unspecified: Secondary | ICD-10-CM | POA: Diagnosis not present

## 2023-01-15 DIAGNOSIS — N138 Other obstructive and reflux uropathy: Secondary | ICD-10-CM | POA: Diagnosis not present

## 2023-01-15 DIAGNOSIS — K8020 Calculus of gallbladder without cholecystitis without obstruction: Secondary | ICD-10-CM | POA: Diagnosis not present

## 2023-01-15 DIAGNOSIS — R030 Elevated blood-pressure reading, without diagnosis of hypertension: Secondary | ICD-10-CM | POA: Diagnosis not present

## 2023-01-15 DIAGNOSIS — M545 Low back pain, unspecified: Secondary | ICD-10-CM | POA: Diagnosis not present

## 2023-01-15 DIAGNOSIS — E7849 Other hyperlipidemia: Secondary | ICD-10-CM | POA: Diagnosis not present

## 2023-01-15 DIAGNOSIS — Z23 Encounter for immunization: Secondary | ICD-10-CM | POA: Diagnosis not present

## 2023-01-15 DIAGNOSIS — I2510 Atherosclerotic heart disease of native coronary artery without angina pectoris: Secondary | ICD-10-CM | POA: Diagnosis not present

## 2023-01-15 DIAGNOSIS — E1140 Type 2 diabetes mellitus with diabetic neuropathy, unspecified: Secondary | ICD-10-CM | POA: Diagnosis not present

## 2023-05-13 DIAGNOSIS — E1140 Type 2 diabetes mellitus with diabetic neuropathy, unspecified: Secondary | ICD-10-CM | POA: Diagnosis not present

## 2023-05-13 DIAGNOSIS — E7849 Other hyperlipidemia: Secondary | ICD-10-CM | POA: Diagnosis not present

## 2023-05-20 DIAGNOSIS — E7849 Other hyperlipidemia: Secondary | ICD-10-CM | POA: Diagnosis not present

## 2023-05-20 DIAGNOSIS — E1140 Type 2 diabetes mellitus with diabetic neuropathy, unspecified: Secondary | ICD-10-CM | POA: Diagnosis not present

## 2023-05-20 DIAGNOSIS — I2510 Atherosclerotic heart disease of native coronary artery without angina pectoris: Secondary | ICD-10-CM | POA: Diagnosis not present

## 2023-05-20 DIAGNOSIS — Z0001 Encounter for general adult medical examination with abnormal findings: Secondary | ICD-10-CM | POA: Diagnosis not present

## 2023-05-20 DIAGNOSIS — Z23 Encounter for immunization: Secondary | ICD-10-CM | POA: Diagnosis not present

## 2023-05-20 DIAGNOSIS — E1165 Type 2 diabetes mellitus with hyperglycemia: Secondary | ICD-10-CM | POA: Diagnosis not present

## 2023-05-20 DIAGNOSIS — E782 Mixed hyperlipidemia: Secondary | ICD-10-CM | POA: Diagnosis not present

## 2023-09-10 DIAGNOSIS — E1140 Type 2 diabetes mellitus with diabetic neuropathy, unspecified: Secondary | ICD-10-CM | POA: Diagnosis not present

## 2023-09-10 DIAGNOSIS — E1165 Type 2 diabetes mellitus with hyperglycemia: Secondary | ICD-10-CM | POA: Diagnosis not present

## 2023-09-10 DIAGNOSIS — E7849 Other hyperlipidemia: Secondary | ICD-10-CM | POA: Diagnosis not present

## 2023-09-17 DIAGNOSIS — I2510 Atherosclerotic heart disease of native coronary artery without angina pectoris: Secondary | ICD-10-CM | POA: Diagnosis not present

## 2023-09-17 DIAGNOSIS — E1165 Type 2 diabetes mellitus with hyperglycemia: Secondary | ICD-10-CM | POA: Diagnosis not present

## 2023-09-17 DIAGNOSIS — E1140 Type 2 diabetes mellitus with diabetic neuropathy, unspecified: Secondary | ICD-10-CM | POA: Diagnosis not present

## 2024-01-12 DIAGNOSIS — E1140 Type 2 diabetes mellitus with diabetic neuropathy, unspecified: Secondary | ICD-10-CM | POA: Diagnosis not present

## 2024-01-12 DIAGNOSIS — E7849 Other hyperlipidemia: Secondary | ICD-10-CM | POA: Diagnosis not present

## 2024-01-19 DIAGNOSIS — N1831 Chronic kidney disease, stage 3a: Secondary | ICD-10-CM | POA: Diagnosis not present

## 2024-01-19 DIAGNOSIS — E1165 Type 2 diabetes mellitus with hyperglycemia: Secondary | ICD-10-CM | POA: Diagnosis not present

## 2024-01-19 DIAGNOSIS — I2510 Atherosclerotic heart disease of native coronary artery without angina pectoris: Secondary | ICD-10-CM | POA: Diagnosis not present

## 2024-01-19 DIAGNOSIS — E1140 Type 2 diabetes mellitus with diabetic neuropathy, unspecified: Secondary | ICD-10-CM | POA: Diagnosis not present
